# Patient Record
Sex: Female | Born: 2005 | Race: White | Hispanic: No | Marital: Single | Smoking: Never smoker
Health system: Southern US, Community
[De-identification: ages and names within clinical notes are randomized; demographics above are authoritative.]

## PROBLEM LIST (undated history)

## (undated) DIAGNOSIS — N83201 Unspecified ovarian cyst, right side: Secondary | ICD-10-CM

## (undated) DIAGNOSIS — R42 Dizziness and giddiness: Secondary | ICD-10-CM

## (undated) HISTORY — DX: Unspecified ovarian cyst, right side: N83.201

## (undated) HISTORY — DX: Dizziness and giddiness: R42

---
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
## 2024-01-22 DIAGNOSIS — H5213 Myopia, bilateral: Secondary | ICD-10-CM | POA: Diagnosis not present

## 2024-02-07 ENCOUNTER — Other Ambulatory Visit: Payer: Self-pay

## 2024-02-09 ENCOUNTER — Other Ambulatory Visit: Payer: Self-pay

## 2024-02-12 ENCOUNTER — Other Ambulatory Visit: Payer: Self-pay

## 2024-03-29 ENCOUNTER — Other Ambulatory Visit: Payer: Self-pay

## 2024-03-29 MED ORDER — NORGESTIMATE-ETH ESTRADIOL 0.25-35 MG-MCG PO TABS
1.0000 | ORAL_TABLET | Freq: Every day | ORAL | 1 refills | Status: AC
  Filled 2024-03-29: qty 56, 56d supply, fill #0
  Filled 2024-05-13: qty 28, 28d supply, fill #0
  Filled 2024-06-06 – 2024-06-19 (×2): qty 28, 28d supply, fill #1

## 2024-05-13 ENCOUNTER — Other Ambulatory Visit: Payer: Self-pay

## 2024-06-06 ENCOUNTER — Other Ambulatory Visit: Payer: Self-pay

## 2024-06-17 ENCOUNTER — Other Ambulatory Visit: Payer: Self-pay

## 2024-06-19 ENCOUNTER — Other Ambulatory Visit: Payer: Self-pay

## 2024-06-19 MED ORDER — NORGESTIMATE-ETH ESTRADIOL 0.25-35 MG-MCG PO TABS
1.0000 | ORAL_TABLET | Freq: Every day | ORAL | 0 refills | Status: AC
  Filled 2024-06-19: qty 84, 84d supply, fill #0

## 2024-06-20 ENCOUNTER — Other Ambulatory Visit: Payer: Self-pay

## 2024-08-30 ENCOUNTER — Other Ambulatory Visit: Payer: Self-pay

## 2024-08-30 DIAGNOSIS — Z0000 Encounter for general adult medical examination without abnormal findings: Secondary | ICD-10-CM | POA: Diagnosis not present

## 2024-08-30 DIAGNOSIS — Z7189 Other specified counseling: Secondary | ICD-10-CM | POA: Diagnosis not present

## 2024-08-30 DIAGNOSIS — Z23 Encounter for immunization: Secondary | ICD-10-CM | POA: Diagnosis not present

## 2024-08-30 DIAGNOSIS — Z1330 Encounter for screening examination for mental health and behavioral disorders, unspecified: Secondary | ICD-10-CM | POA: Diagnosis not present

## 2024-08-30 DIAGNOSIS — Z113 Encounter for screening for infections with a predominantly sexual mode of transmission: Secondary | ICD-10-CM | POA: Diagnosis not present

## 2024-08-30 DIAGNOSIS — Z68.41 Body mass index (BMI) pediatric, 5th percentile to less than 85th percentile for age: Secondary | ICD-10-CM | POA: Diagnosis not present

## 2024-08-30 DIAGNOSIS — Z713 Dietary counseling and surveillance: Secondary | ICD-10-CM | POA: Diagnosis not present

## 2024-08-30 MED ORDER — NORGESTIMATE-ETH ESTRADIOL 0.25-35 MG-MCG PO TABS
1.0000 | ORAL_TABLET | Freq: Every day | ORAL | 3 refills | Status: AC
  Filled 2024-08-30: qty 84, 84d supply, fill #0
  Filled 2024-11-23 (×2): qty 84, 84d supply, fill #1

## 2024-11-23 ENCOUNTER — Other Ambulatory Visit: Payer: Self-pay
